# Patient Record
Sex: Male | Born: 1975 | Race: White | Hispanic: No | Marital: Married | Smoking: Never smoker
Health system: Southern US, Community
[De-identification: ages and names within clinical notes are randomized; demographics above are authoritative.]

## PROBLEM LIST (undated history)

## (undated) DIAGNOSIS — R7303 Prediabetes: Secondary | ICD-10-CM

## (undated) HISTORY — PX: TONSILLECTOMY: SUR1361

## (undated) HISTORY — PX: KNEE ARTHROSCOPY: SHX127

## (undated) HISTORY — PX: WISDOM TOOTH EXTRACTION: SHX21

---

## 2015-03-17 ENCOUNTER — Ambulatory Visit
Admission: EM | Admit: 2015-03-17 | Discharge: 2015-03-17 | Disposition: A | Discharge status: Home / Self Care | Payer: BLUE CROSS/BLUE SHIELD | Attending: Family Medicine | Admitting: Family Medicine

## 2015-03-17 DIAGNOSIS — R1011 Right upper quadrant pain: Secondary | ICD-10-CM | POA: Diagnosis not present

## 2015-03-17 DIAGNOSIS — R109 Unspecified abdominal pain: Secondary | ICD-10-CM

## 2015-03-17 DIAGNOSIS — R100 Acute abdomen: Secondary | ICD-10-CM | POA: Diagnosis not present

## 2015-03-17 DIAGNOSIS — Z711 Person with feared health complaint in whom no diagnosis is made: Secondary | ICD-10-CM

## 2015-03-17 LAB — COMPREHENSIVE METABOLIC PANEL
ALBUMIN: 4.7 g/dL (ref 3.5–5.0)
ALT: 26 U/L (ref 17–63)
AST: 32 U/L (ref 15–41)
Alkaline Phosphatase: 57 U/L (ref 38–126)
Anion gap: 8 (ref 5–15)
BILIRUBIN TOTAL: 0.7 mg/dL (ref 0.3–1.2)
BUN: 21 mg/dL — AB (ref 6–20)
CO2: 27 mmol/L (ref 22–32)
Calcium: 9.6 mg/dL (ref 8.9–10.3)
Chloride: 99 mmol/L — ABNORMAL LOW (ref 101–111)
Creatinine, Ser: 1.17 mg/dL (ref 0.61–1.24)
GFR calc Af Amer: >60 mL/min (ref >60)
GFR calc non Af Amer: >60 mL/min (ref >60)
GLUCOSE: 106 mg/dL — AB (ref 65–99)
POTASSIUM: 4.5 mmol/L (ref 3.5–5.1)
Sodium: 134 mmol/L — ABNORMAL LOW (ref 135–145)
TOTAL PROTEIN: 8.3 g/dL — AB (ref 6.5–8.1)

## 2015-03-17 LAB — URINALYSIS COMPLETE WITH MICROSCOPIC (ARMC ONLY)
BILIRUBIN URINE: NEGATIVE
GLUCOSE, UA: NEGATIVE mg/dL
Hgb urine dipstick: NEGATIVE
Ketones, ur: NEGATIVE mg/dL
LEUKOCYTES UA: NEGATIVE
Nitrite: NEGATIVE
PH: 6.5 (ref 5.0–8.0)
Protein, ur: NEGATIVE mg/dL
RBC / HPF: NONE SEEN RBC/hpf (ref 0–5)
SQUAMOUS EPITHELIAL / LPF: NONE SEEN
Specific Gravity, Urine: 1.025 (ref 1.005–1.030)

## 2015-03-17 LAB — CBC WITH DIFFERENTIAL/PLATELET
Basophils Absolute: 0.1 10*3/uL (ref 0–0.1)
Basophils Relative: 1 %
EOS ABS: 0.1 10*3/uL (ref 0–0.7)
EOS PCT: 2 %
HCT: 46.4 % (ref 40.0–52.0)
Hemoglobin: 15.7 g/dL (ref 13.0–18.0)
LYMPHS ABS: 2 10*3/uL (ref 1.0–3.6)
Lymphocytes Relative: 24 %
MCH: 29.1 pg (ref 26.0–34.0)
MCHC: 33.9 g/dL (ref 32.0–36.0)
MCV: 85.8 fL (ref 80.0–100.0)
MONO ABS: 1.2 10*3/uL — AB (ref 0.2–1.0)
MONOS PCT: 14 %
Neutro Abs: 5.3 10*3/uL (ref 1.4–6.5)
Neutrophils Relative %: 61 %
PLATELETS: 191 10*3/uL (ref 150–440)
RBC: 5.4 MIL/uL (ref 4.40–5.90)
RDW: 13 % (ref 11.5–14.5)
WBC: 8.7 10*3/uL (ref 3.8–10.6)

## 2015-03-17 LAB — AMYLASE: AMYLASE: 71 U/L (ref 28–100)

## 2015-03-17 LAB — LIPASE, BLOOD: Lipase: 29 U/L (ref 11–51)

## 2015-03-17 MED ORDER — SUCRALFATE 1 G PO TABS: 2.0000 g | ORAL_TABLET | Freq: Two times a day (BID) | ORAL | Status: DC

## 2015-03-17 MED ORDER — HYDROCODONE-ACETAMINOPHEN 5-325 MG PO TABS: 1.0000 | ORAL_TABLET | Freq: Three times a day (TID) | ORAL | Status: DC | PRN

## 2015-03-17 NOTE — ED Provider Notes (Signed)
CSN: [id number]     Arrival date & time 03/17/15  1839 History   First MD Initiated Contact with Patient 03/17/15 1928    Nurses notes were reviewed. Chief Complaint  Patient presents with  . Abdominal Pain   Patient reports 2 day history of abdominal pain. Abdominal pain started yesterday. Can't was states whether it was early morning but has been persistent. He states his been some minor fluctuations he can really tell major. He has any a lot of fatty foods but has has some food cooked in oils and combined with cheeses. He denies any nausea vomiting. Denies any diarrhea. He states that he has moved his bowels several times a day stools off a little bit but no improvement with the discomfort. No signs of changes of the pain. No history of fever no previous history abdominal surgery either.   No pertinent medical history other than tonsillectomy and some orthopedic surgeries. Never smoked he is allergic to penicillin. He does use alcohol but doesn't drink excessively.  His paternal grandfather has diabetes and his father has been recently diagnosed with oral cancer he doesn't smoke. The also household is sick.   (Consider location/radiation/quality/duration/timing/severity/associated sxs/prior Treatment) Patient is a 40 y.o. male presenting with abdominal pain. The history is provided by the patient. No language interpreter was used.  Abdominal Pain Pain location:  Periumbilical and RUQ Pain quality: cramping, fullness and pressure   Pain radiates to:  Does not radiate Pain severity:  Moderate Duration:  2 days Timing:  Constant Progression:  Worsening Chronicity:  New Context: alcohol use and eating   Context: not awakening from sleep, not diet changes, not laxative use, not medication withdrawal, not previous surgeries, not recent illness, not retching, not sick contacts and not suspicious food intake   Relieved by:  Nothing Ineffective treatments:  Belching and bowel  activity Associated symptoms: anorexia   Risk factors: no alcohol abuse, has not had multiple surgeries, no NSAID use and no recent hospitalization     History reviewed. No pertinent past medical history. Past Surgical History  Procedure Laterality Date  . Tonsillectomy     History reviewed. No pertinent family history. Social History  Substance Use Topics  . Smoking status: Never Smoker   . Smokeless tobacco: None  . Alcohol Use: No    Review of Systems  Gastrointestinal: Positive for abdominal pain and anorexia.  All other systems reviewed and are negative.   Allergies  Penicillins  Home Medications   Prior to Admission medications   Medication Sig Start Date End Date Taking? Authorizing Provider  HYDROcodone-acetaminophen (NORCO) 5-325 MG tablet Take 1 tablet by mouth every 8 (eight) hours as needed for moderate pain. 03/17/15   Frederich Cha, MD  sucralfate (CARAFATE) 1 g tablet Take 2 tablets (2 g total) by mouth 2 (two) times daily. An hour before eating. 03/17/15   Frederich Cha, MD   Meds Ordered and Administered this Visit  Medications - No data to display  BP 115/76 mmHg  Pulse 66  Temp(Src) 97.6 F (36.4 C) (Oral)  Resp 16  SpO2 100% No data found.   Physical Exam  Constitutional: He is oriented to person, place, and time. He appears well-developed.  HENT:  Head: Normocephalic.  Eyes: Conjunctivae are normal. Pupils are equal, round, and reactive to light.  Neck: Normal range of motion.  Cardiovascular: Normal rate, regular rhythm and normal heart sounds.   Pulmonary/Chest: Effort normal and breath sounds normal. No respiratory distress.  Abdominal: Soft.  Bowel sounds are normal. There is no hepatosplenomegaly. There is tenderness in the right upper quadrant and epigastric area. There is no CVA tenderness. No hernia. Hernia confirmed negative in the ventral area.    Musculoskeletal: Normal range of motion.  Lymphadenopathy:    He has no cervical  adenopathy.  Neurological: He is alert and oriented to person, place, and time.  Skin: Skin is warm and dry.  Psychiatric: He has a normal mood and affect.  Vitals reviewed.   ED Course  Procedures (including critical care time)  Labs Review Labs Reviewed  CBC WITH DIFFERENTIAL/PLATELET - Abnormal; Notable for the following:    Monocytes Absolute 1.2 (*)    All other components within normal limits  URINALYSIS COMPLETEWITH MICROSCOPIC ([facility] ONLY) - Abnormal; Notable for the following:    Bacteria, UA RARE (*)    All other components within normal limits  COMPREHENSIVE METABOLIC PANEL - Abnormal; Notable for the following:    Sodium 134 (*)    Chloride 99 (*)    Glucose, Bld 106 (*)    BUN 21 (*)    Total Protein 8.3 (*)    All other components within normal limits  URINE CULTURE  AMYLASE  LIPASE, BLOOD  H PYLORI, IGM, IGG, IGA AB    Imaging Review No results found.   Visual Acuity Review  Right Eye Distance:   Left Eye Distance:   Bilateral Distance:    Right Eye Near:   Left Eye Near:    Bilateral Near:     Results for orders placed or performed during the hospital encounter of 03/17/15  CBC with Differential  Result Value Ref Range   WBC 8.7 3.8 - 10.6 K/uL   RBC 5.40 4.40 - 5.90 MIL/uL   Hemoglobin 15.7 13.0 - 18.0 g/dL   HCT 46.4 40.0 - 52.0 %   MCV 85.8 80.0 - 100.0 fL   MCH 29.1 26.0 - 34.0 pg   MCHC 33.9 32.0 - 36.0 g/dL   RDW 13.0 11.5 - 14.5 %   Platelets 191 150 - 440 K/uL   Neutrophils Relative % 61 %   Neutro Abs 5.3 1.4 - 6.5 K/uL   Lymphocytes Relative 24 %   Lymphs Abs 2.0 1.0 - 3.6 K/uL   Monocytes Relative 14 %   Monocytes Absolute 1.2 (H) 0.2 - 1.0 K/uL   Eosinophils Relative 2 %   Eosinophils Absolute 0.1 0 - 0.7 K/uL   Basophils Relative 1 %   Basophils Absolute 0.1 0 - 0.1 K/uL  Amylase  Result Value Ref Range   Amylase 71 28 - 100 U/L  Lipase, blood  Result Value Ref Range   Lipase 29 11 - 51 U/L  Urinalysis complete,  with microscopic  Result Value Ref Range   Color, Urine YELLOW YELLOW   APPearance CLEAR CLEAR   Glucose, UA NEGATIVE NEGATIVE mg/dL   Bilirubin Urine NEGATIVE NEGATIVE   Ketones, ur NEGATIVE NEGATIVE mg/dL   Specific Gravity, Urine 1.025 1.005 - 1.030   Hgb urine dipstick NEGATIVE NEGATIVE   pH 6.5 5.0 - 8.0   Protein, ur NEGATIVE NEGATIVE mg/dL   Nitrite NEGATIVE NEGATIVE   Leukocytes, UA NEGATIVE NEGATIVE   RBC / HPF NONE SEEN 0 - 5 RBC/hpf   WBC, UA 0-5 0 - 5 WBC/hpf   Bacteria, UA RARE (A) NONE SEEN   Squamous Epithelial / LPF NONE SEEN NONE SEEN  Comprehensive metabolic panel  Result Value Ref Range   Sodium 134 (L)  135 - 145 mmol/L   Potassium 4.5 3.5 - 5.1 mmol/L   Chloride 99 (L) 101 - 111 mmol/L   CO2 27 22 - 32 mmol/L   Glucose, Bld 106 (H) 65 - 99 mg/dL   BUN 21 (H) 6 - 20 mg/dL   Creatinine, Ser 1.17 0.61 - 1.24 mg/dL   Calcium 9.6 8.9 - 10.3 mg/dL   Total Protein 8.3 (H) 6.5 - 8.1 g/dL   Albumin 4.7 3.5 - 5.0 g/dL   AST 32 15 - 41 U/L   ALT 26 17 - 63 U/L   Alkaline Phosphatase 57 38 - 126 U/L   Total Bilirubin 0.7 0.3 - 1.2 mg/dL   GFR calc non Af Amer >60 >60 mL/min   GFR calc Af Amer >60 >60 mL/min   Anion gap 8 5 - 15     MDM   1. Concern about gallstones without diagnosis   2. Right upper quadrant pain   3. Abdominal pain, acute      Lab tests negative essentially. Still awaiting H. pylori. We'll plan for ultrasound of his gallbladder tomorrow. Avastin that if he does not hear from [place] by 10 to please call the office. Will going to placement Carafate 2 tablets twice a day also hydrocodone as needed for pain discomfort. Follow-up PCP if not better in a week obviously. Work note written for to tomorrow as well. If abdominal pain gets worse she'll need to go to the ED of his choice. Note: This dictation was prepared with Dragon dictation along with smaller phrase technology. Any transcriptional errors that result from this process are  unintentional.    Frederich Cha, MD 03/17/15 2133

## 2015-03-17 NOTE — ED Notes (Signed)
abd pain reported.  Epigastric in nature.  "feels like gas cramp".  Still able to eat.  Denies nausea.   Similar symptoms were reported in a coworker.

## 2015-03-17 NOTE — Discharge Instructions (Signed)
Abdominal Pain, Adult °Many things can cause belly (abdominal) pain. Most times, the belly pain is not dangerous. Many cases of belly pain can be watched and treated at home. °HOME CARE  °· Do not take medicines that help you go poop (laxatives) unless told to by your doctor. °· Only take medicine as told by your doctor. °· Eat or drink as told by your doctor. Your doctor will tell you if you should be on a special diet. °GET HELP IF: °· You do not know what is causing your belly pain. °· You have belly pain while you are sick to your stomach (nauseous) or have runny poop (diarrhea). °· You have pain while you pee or poop. °· Your belly pain wakes you up at night. °· You have belly pain that gets worse or better when you eat. °· You have belly pain that gets worse when you eat fatty foods. °· You have a fever. °GET HELP RIGHT AWAY IF:  °· The pain does not go away within 2 hours. °· You keep throwing up (vomiting). °· The pain changes and is only in the right or left part of the belly. °· You have bloody or tarry looking poop. °MAKE SURE YOU:  °· Understand these instructions. °· Will watch your condition. °· Will get help right away if you are not doing well or get worse. °  °This information is not intended to replace advice given to you by your health care provider. Make sure you discuss any questions you have with your health care provider. °  °Document Released: 07/11/2007 Document Revised: 02/12/2014 Document Reviewed: 10/01/2012 °Elsevier Interactive Patient Education ©2016 [employer] ° °Pain Without a Known Cause °WHAT IS PAIN WITHOUT A KNOWN CAUSE? °Pain can occur in any part of the body and can range from mild to severe. Sometimes no cause can be found for why you are having pain. Some types of pain that can occur without a known cause include:  °· Headache. °· Back pain. °· Abdominal pain. °· Neck pain. °HOW IS PAIN WITHOUT A KNOWN CAUSE DIAGNOSED?  °Your health care provider will try to find the cause  of your pain. This may include: °· Physical exam. °· Medical history. °· Blood tests. °· Urine tests. °· X-rays. °If no cause is found, your health care provider may diagnose you with pain without a known cause.  °IS THERE TREATMENT FOR PAIN WITHOUT A CAUSE?  °Treatment depends on the kind of pain you have. Your health care provider may prescribe medicines to help relieve your pain.  °WHAT CAN I DO AT HOME FOR MY PAIN?  °· Take medicines only as directed by your health care provider. °· Stop any activities that cause pain. During periods of severe pain, bed rest may help. °· Try to reduce your stress with activities such as yoga or meditation. Talk to your health care provider for other stress-reducing activity recommendations. °· Exercise regularly, if approved by your health care provider. °· Eat a healthy diet that includes fruits and vegetables. This may improve pain. Talk to your health care provider if you have any questions about your diet. °WHAT IF MY PAIN DOES NOT GET BETTER?  °If you have a painful condition and no reason can be found for the pain or the pain gets worse, it is important to follow up with your health care provider. It may be necessary to repeat tests and look further for a possible cause.  °  °This information is not   intended to replace advice given to you by your health care provider. Make sure you discuss any questions you have with your health care provider. °  °Document Released: 10/17/2000 Document Revised: 02/12/2014 Document Reviewed: 06/09/2013 °Elsevier Interactive Patient Education ©2016 [employer] ° °

## 2015-03-17 NOTE — ED Notes (Signed)
Ambulatory to treatment room.  In NAD.  Reporting MID EPIGASTRIC pain.  Last BM was today.  No issues reported.

## 2015-03-18 ENCOUNTER — Ambulatory Visit
Admission: RE | Admit: 2015-03-18 | Discharge: 2015-03-18 | Disposition: A | Discharge status: Home / Self Care | Payer: BLUE CROSS/BLUE SHIELD | Source: Ambulatory Visit | Attending: Family Medicine | Admitting: Family Medicine

## 2015-03-18 ENCOUNTER — Telehealth: Payer: Self-pay | Admitting: Emergency Medicine

## 2015-03-18 DIAGNOSIS — R1010 Upper abdominal pain, unspecified: Secondary | ICD-10-CM | POA: Insufficient documentation

## 2015-03-18 DIAGNOSIS — K7689 Other specified diseases of liver: Secondary | ICD-10-CM | POA: Diagnosis not present

## 2015-03-18 NOTE — ED Notes (Signed)
Returned [place] results to pt via phone per Dr. Zenda Alpers. Pt was notified that [place] of gallbladder came back negative for gallstones. An incidental finding of 1.9 cm probable hemangioma in the right lobe of liver was reported to pt as well and was advised to follow up with pcp for further eval. Pt understanding and is ok of results.

## 2015-03-20 LAB — URINE CULTURE
Culture: NO GROWTH
Special Requests: NORMAL

## 2015-03-21 LAB — H PYLORI, IGM, IGG, IGA AB
H. Pylogi, Iga Abs: <9 units (ref 0.0–8.9)
H. Pylogi, Igm Abs: <9 units (ref 0.0–8.9)

## 2015-03-22 ENCOUNTER — Telehealth: Payer: Self-pay

## 2015-03-22 NOTE — ED Notes (Signed)
Patient contacted and informed Labs were negative and that Ultrasound showed ? Hemangioma and that followup with primary care doctor recommended in 6-12 months. States had already received a call from [place].

## 2018-02-09 IMAGING — US US ABDOMEN LIMITED
1 series · 14 of 25 positions shown · non-contrast
Comparison: None.

CLINICAL DATA: Upper abdominal pain for 2 days.

EXAM:
US ABDOMEN LIMITED - RIGHT UPPER QUADRANT

[Series 1: us abdomen limited · 0.19mm/px · 14 of 49 slices shown]
[im 1/49]
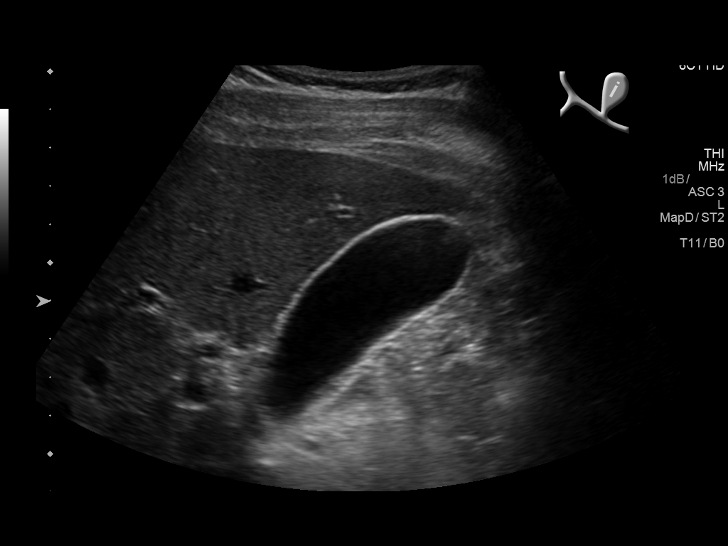
[im 5/49]
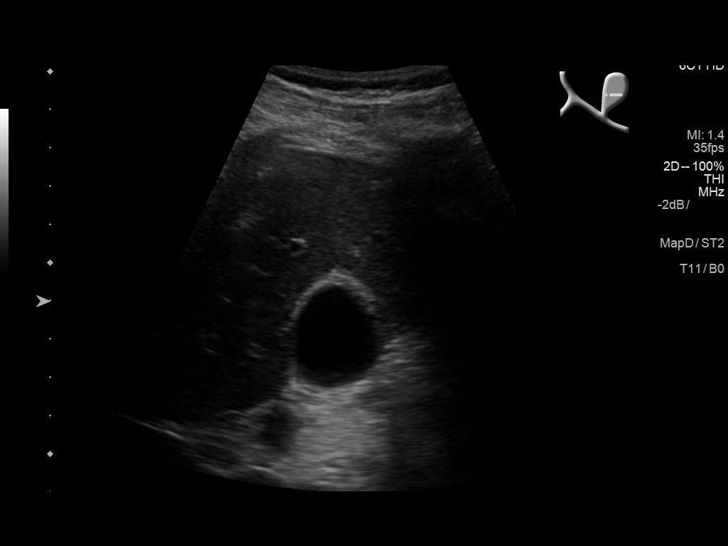
[im 9/49]
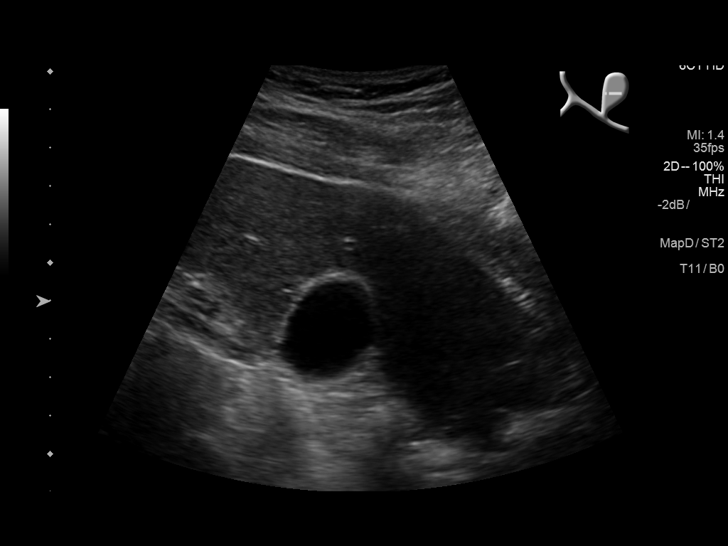
[im 13/49]
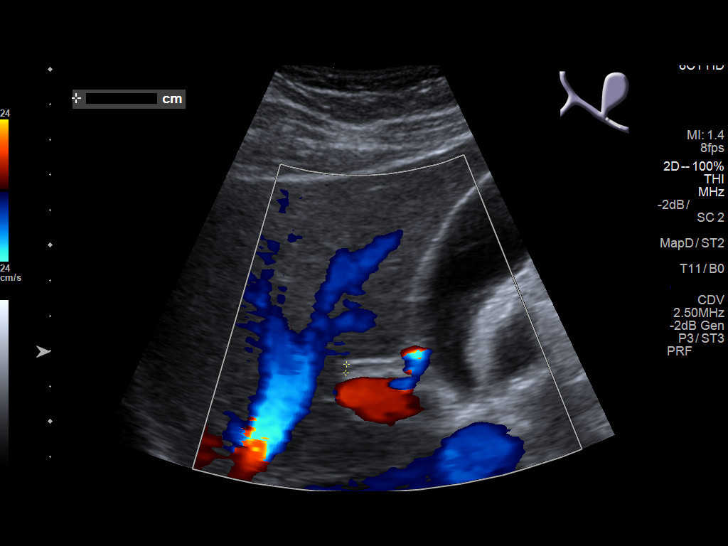
[im 17/49]
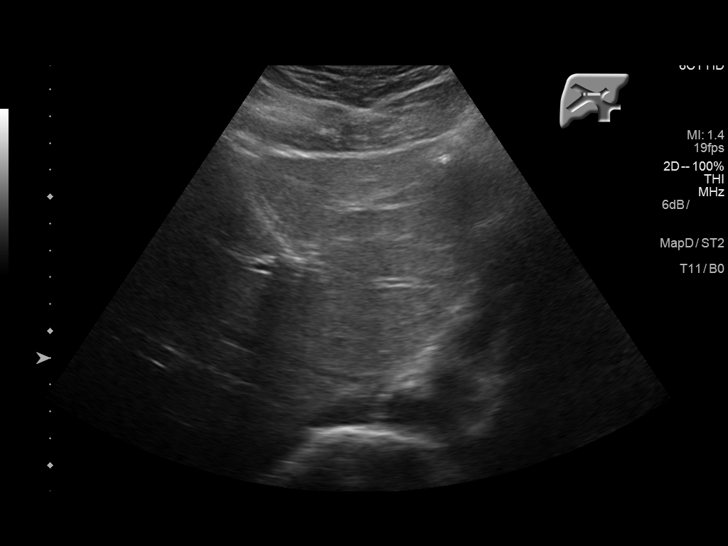
[im 19/49]
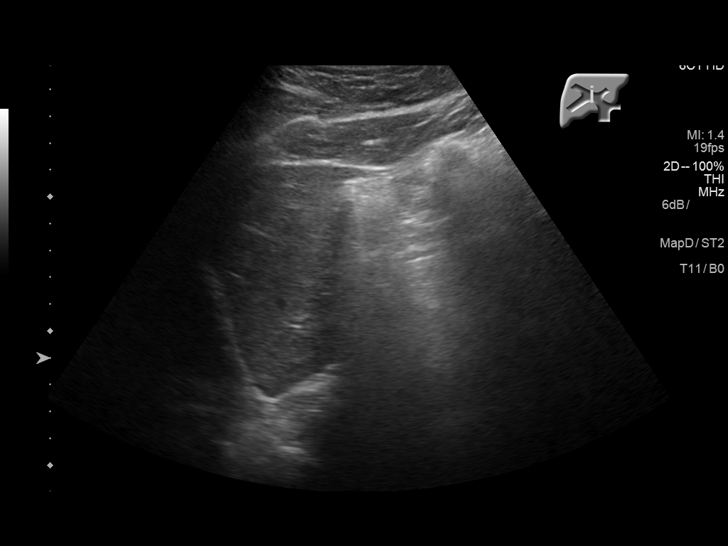
[im 23/49]
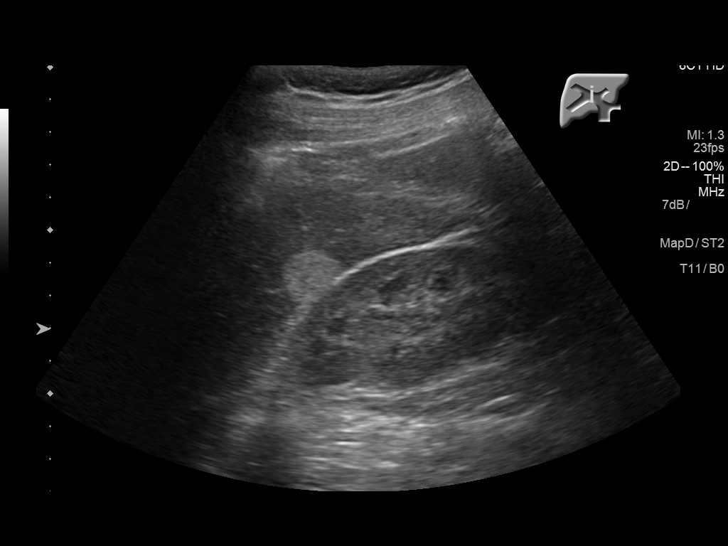
[im 27/49]
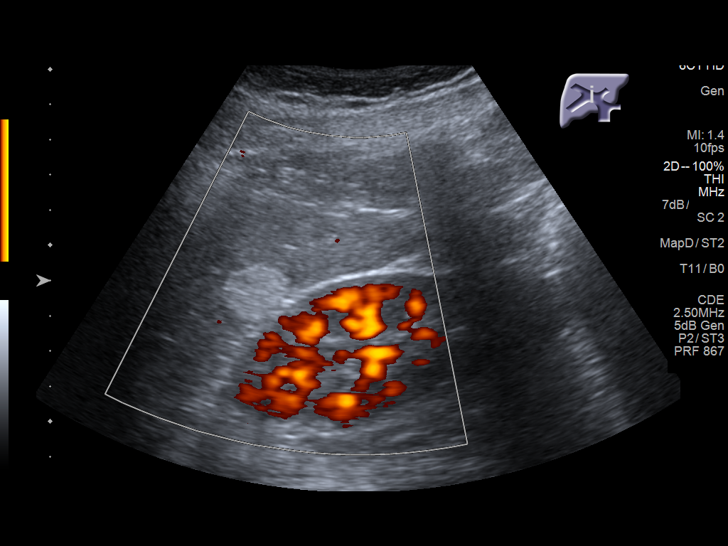
[im 31/49]
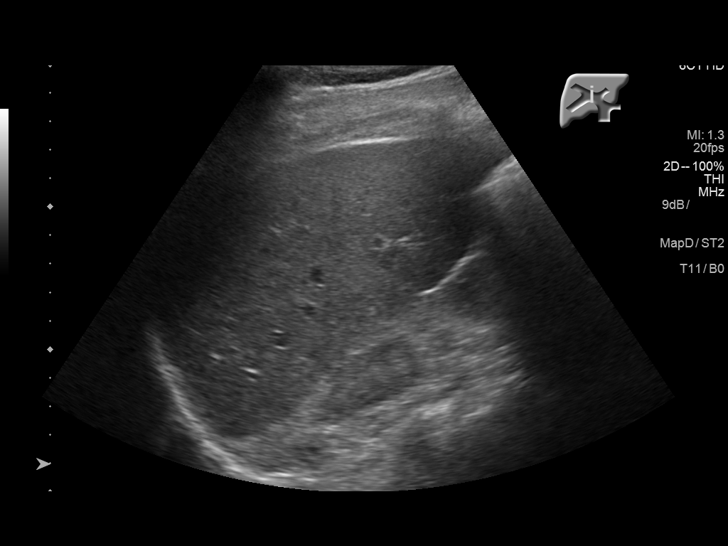
[im 33/49]
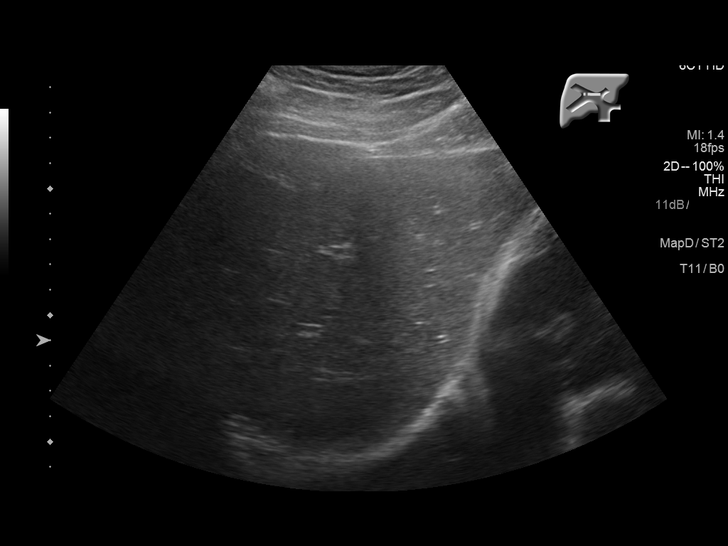
[im 37/49]
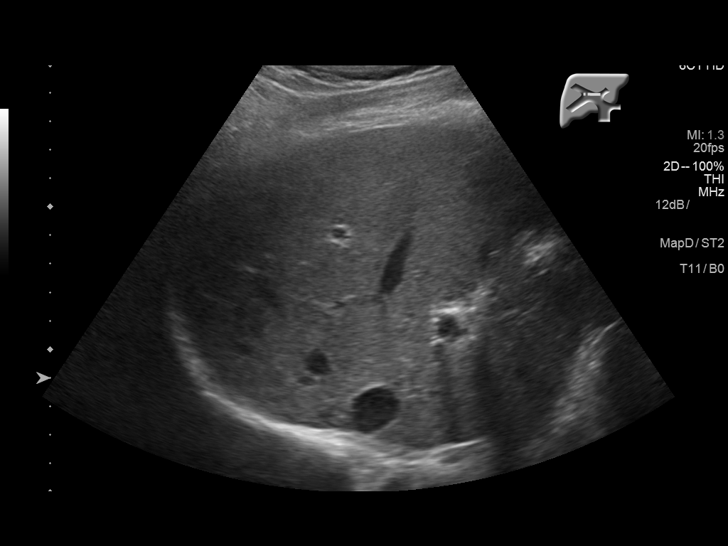
[im 41/49]
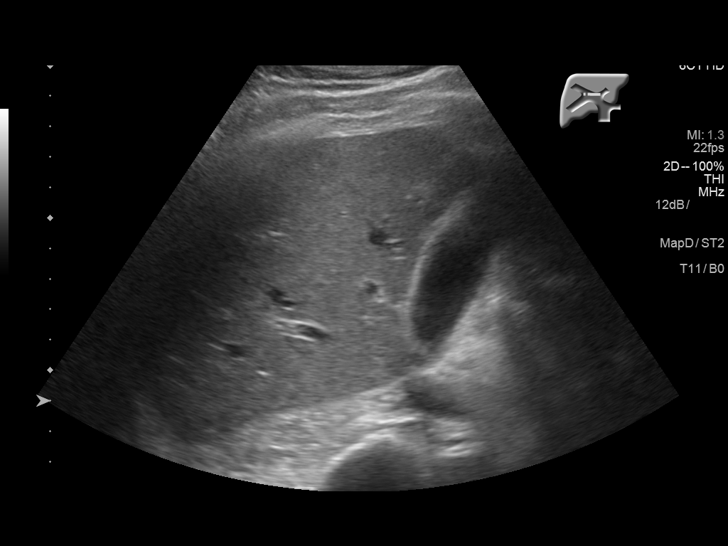
[im 45/49]
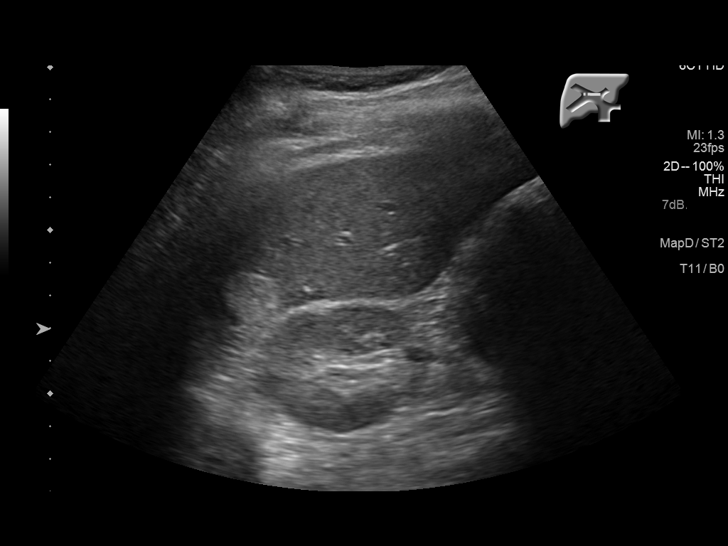
[im 49/49]
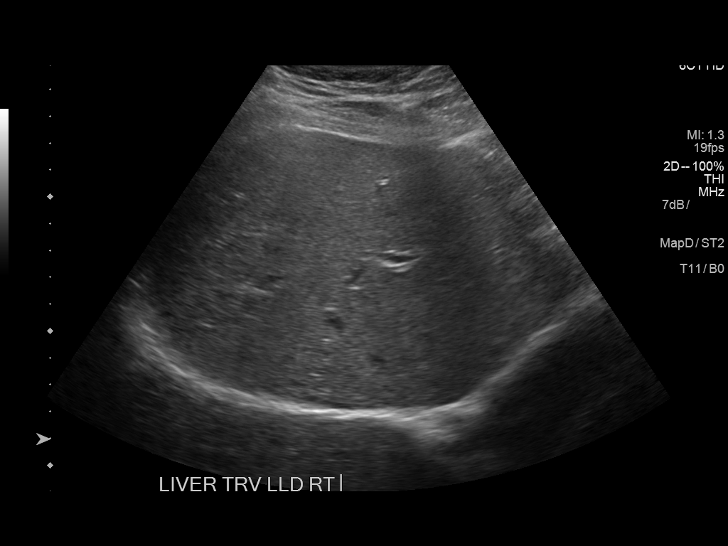

[14 of 25 positions shown; findings below may reference images not displayed]

FINDINGS: Gallbladder:

No gallstones or wall thickening visualized. No sonographic Murphy
sign noted by sonographer. Gallbladder wall measures 1.7 mm.

Common bile duct:

Diameter: 2.4 mm.

Liver:

Within normal limits in parenchymal echogenicity. There is a 1.8 x
1.9 x 1.5 cm subcapsular hyperechoic mass within the right lobe of
the liver, which in the absence of findings of cirrhosis likely
represents a hemangioma.
IMPRESSION: Normal appearance of the gallbladder.

1.9 cm probable hemangioma in the right lobe of the liver, otherwise
normal appearance of the liver. Follow-up in 6-12 months may be
considered.

## 2019-04-21 ENCOUNTER — Ambulatory Visit: Payer: Self-pay | Admitting: Surgery

## 2019-04-21 NOTE — H&P (Signed)
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 male who was referred by Heron Nay * for evaluation of above. First noted several years ago.  Asymptomatic, but doesn't like the look of it so will like to consider removal   Past Medical History:  has a past medical history of Allergic state, Hyperlipidemia, Hypothyroid, unspecified (05/28/2012), and OSA (obstructive sleep apnea) (05/23/2012).  Past Surgical History:  has a past surgical history that includes Tonsillectomy; knee surgery; and vasectomy.  Family History: family history includes Alzheimer's disease in his maternal grandfather and maternal grandmother; Cancer in his father; Diabetes type II in his maternal grandfather; Hyperlipidemia (Elevated cholesterol) in his mother.  Social History:  reports that he has never smoked. He has never used smokeless tobacco. He reports current alcohol use of about 1.0 standard drinks of alcohol per week. He reports that he does not use drugs.  Current Medications: has a current medication list which includes the following prescription(s): rosuvastatin, fluticasone propionate, and hydrocodone-homatropine.  Allergies:       Allergies  Allergen Reactions  . Demerol (Pf) [Meperidine (Pf)] Unknown  . Penicillins Other (See Comments)    Other Reaction: Allergy    ROS:  A 15 point review of systems was performed and pertinent positives and negatives noted in HPI   Objective:   BP 124/71   Pulse 101   Ht 170.2 cm (5\' 7" )   Wt 77.5 kg (170 lb 14.4 oz)   BMI 26.77 kg/m   Constitutional :  alert, appears stated age, cooperative and no distress  Lymphatics/Throat:  no asymmetry, masses, or scars  Respiratory:  clear to auscultation bilaterally  Cardiovascular:  irregularly irregular rhythm  Gastrointestinal: soft, non-tender; bowel sounds normal; no masses,  no organomegaly.    Musculoskeletal: Steady gait and movement  Skin:  Cool and moist, left perineal region with increased soft tissue, with another discrete smooth, soft nodular mass atop the increased soft tissue.  Palpation consistent with two lipomas on top of each other, with chronic skin changes overlying the top lipoma. Non-tender, no erythema, induration to indicate infection.  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  Alternatives include continued observation.  Benefits include possible symptom relief, pathologic evaluation, improved cosmesis. Discussed the risk of surgery including recurrence, chronic pain, post-op infxn, poor cosmesis, poor/delayed wound healing, and possible re-operation to address said risks. The risks of general anesthetic, if used, includes MI, CVA, sudden death or even reaction to anesthetic medications also discussed.  Typical post-op recovery time of 3-5 days with possible activity restrictions were also discussed.  The patient verbalized understanding and all questions were answered to the patient's satisfaction.  2. Patient has elected to proceed with surgical treatment. Procedure will be scheduled in OR due to location.  Written consent was obtained. High litho position

## 2019-04-21 NOTE — H&P (View-Only) (Signed)
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 male who was referred by Heron Nay * for evaluation of above. First noted several years ago.  Asymptomatic, but doesn't like the look of it so will like to consider removal   Past Medical History:  has a past medical history of Allergic state, Hyperlipidemia, Hypothyroid, unspecified (05/28/2012), and OSA (obstructive sleep apnea) (05/23/2012).  Past Surgical History:  has a past surgical history that includes Tonsillectomy; knee surgery; and vasectomy.  Family History: family history includes Alzheimer's disease in his maternal grandfather and maternal grandmother; Cancer in his father; Diabetes type II in his maternal grandfather; Hyperlipidemia (Elevated cholesterol) in his mother.  Social History:  reports that he has never smoked. He has never used smokeless tobacco. He reports current alcohol use of about 1.0 standard drinks of alcohol per week. He reports that he does not use drugs.  Current Medications: has a current medication list which includes the following prescription(s): rosuvastatin, fluticasone propionate, and hydrocodone-homatropine.  Allergies:       Allergies  Allergen Reactions  . Demerol (Pf) [Meperidine (Pf)] Unknown  . Penicillins Other (See Comments)    Other Reaction: Allergy    ROS:  A 15 point review of systems was performed and pertinent positives and negatives noted in HPI   Objective:   BP 124/71   Pulse 101   Ht 170.2 cm (5\' 7" )   Wt 77.5 kg (170 lb 14.4 oz)   BMI 26.77 kg/m   Constitutional :  alert, appears stated age, cooperative and no distress  Lymphatics/Throat:  no asymmetry, masses, or scars  Respiratory:  clear to auscultation bilaterally  Cardiovascular:  irregularly irregular rhythm  Gastrointestinal: soft, non-tender; bowel sounds normal; no masses,  no organomegaly.    Musculoskeletal: Steady gait and movement  Skin:  Cool and moist, left perineal region with increased soft tissue, with another discrete smooth, soft nodular mass atop the increased soft tissue.  Palpation consistent with two lipomas on top of each other, with chronic skin changes overlying the top lipoma. Non-tender, no erythema, induration to indicate infection.  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  Alternatives include continued observation.  Benefits include possible symptom relief, pathologic evaluation, improved cosmesis. Discussed the risk of surgery including recurrence, chronic pain, post-op infxn, poor cosmesis, poor/delayed wound healing, and possible re-operation to address said risks. The risks of general anesthetic, if used, includes MI, CVA, sudden death or even reaction to anesthetic medications also discussed.  Typical post-op recovery time of 3-5 days with possible activity restrictions were also discussed.  The patient verbalized understanding and all questions were answered to the patient's satisfaction.  2. Patient has elected to proceed with surgical treatment. Procedure will be scheduled in OR due to location.  Written consent was obtained. High litho position

## 2019-04-30 ENCOUNTER — Encounter
Admission: RE | Admit: 2019-04-30 | Discharge: 2019-04-30 | Disposition: A | Discharge status: Home / Self Care | Payer: BC Managed Care – PPO | Source: Ambulatory Visit | Attending: Surgery | Admitting: Surgery

## 2019-04-30 ENCOUNTER — Other Ambulatory Visit: Payer: Self-pay

## 2019-04-30 HISTORY — DX: Prediabetes: R73.03

## 2019-04-30 NOTE — Patient Instructions (Signed)
Your procedure is scheduled on: Thurs 4/1 Report to Day Surgery. To find out your arrival time please call [phone] between 1PM - 3PM Wed. 3/31  Remember: Instructions that are not followed completely may result in serious medical risk,  up to and including death, or upon the discretion of your surgeon and anesthesiologist your  surgery may need to be rescheduled.     _X__ 1. Do not eat food after midnight the night before your procedure.                 No gum chewing or hard candies. You may drink clear liquids up to 2 hours                 before you are scheduled to arrive for your surgery- DO not drink clear                 liquids within 2 hours of the start of your surgery.                 Clear Liquids include:  water, apple juice without pulp, clear Gatorade, G2 or                  Gatorade Zero (avoid Red/Purple/Blue), Black Coffee or Tea (Do not add                 anything to coffee or tea). _____2.   Complete the carbohydrate drink provided to you, 2 hours before arrival.  __X__2.  On the morning of surgery brush your teeth with toothpaste and water, you                may rinse your mouth with mouthwash if you wish.  Do not swallow any toothpaste of mouthwash.     _X__ 3.  No Alcohol for 24 hours before or after surgery.   ___ 4.  Do Not Smoke or use e-cigarettes For 24 Hours Prior to Your Surgery.                 Do not use any chewable tobacco products for at least 6 hours prior to                 surgery.  ____  5.  Bring all medications with you on the day of surgery if instructed.   __x__  6.  Notify your doctor if there is any change in your medical condition      (cold, fever, infections).     Do not wear jewelry,  Do not wear lotions. You may wear deodorant. Do not shave 48 hours prior to surgery. Men may shave face and neck. Do not bring valuables to the hospital.    [facility] is not responsible for any belongings or  valuables.  Contacts, dentures or bridgework may not be worn into surgery. Leave your suitcase in the car. After surgery it may be brought to your room. For patients admitted to the hospital, discharge time is determined by your treatment team.   Patients discharged the day of surgery will not be allowed to drive home.   Make arrangements for someone to be with you for the first 24 hours of your Same Day Discharge.    Please read over the following fact sheets that you were given:      __x__ Take these medicines the morning of surgery with A SIP OF WATER:    1. crestor  2.   3.  4.  5.  6.  ____ Fleet Enema (as directed)   __x__ Use CHG Soap (or wipes) as directed  ____ Use Benzoyl Peroxide Gel as instructed  ____ Use inhalers on the day of surgery  ____ Stop metformin 2 days prior to surgery    ____ Take 1/2 of usual insulin dose the night before surgery. No insulin the morning          of surgery.   ____ Stop Coumadin/Plavix/aspirin on  _x__ Stop Anti-inflammatories no ibuprofen aleve or aspirin until after surgery        May take tylenol   ____ Stop supplements until after surgery.    ____ Bring C-Pap to the hospital.

## 2019-05-05 ENCOUNTER — Other Ambulatory Visit: Payer: Self-pay

## 2019-05-05 ENCOUNTER — Other Ambulatory Visit
Admission: RE | Admit: 2019-05-05 | Discharge: 2019-05-05 | Disposition: A | Discharge status: Home / Self Care | Payer: BC Managed Care – PPO | Source: Ambulatory Visit | Attending: Surgery | Admitting: Surgery

## 2019-05-05 DIAGNOSIS — Z20822 Contact with and (suspected) exposure to covid-19: Secondary | ICD-10-CM | POA: Insufficient documentation

## 2019-05-05 DIAGNOSIS — Z01812 Encounter for preprocedural laboratory examination: Secondary | ICD-10-CM | POA: Diagnosis not present

## 2019-05-05 LAB — SARS CORONAVIRUS 2 (TAT 6-24 HRS): SARS Coronavirus 2: NEGATIVE

## 2019-05-06 MED ORDER — CLINDAMYCIN PHOSPHATE 900 MG/50ML IV SOLN
900.0000 mg | INTRAVENOUS | Status: AC
  Administered 2019-05-07: 900 mg via INTRAVENOUS

## 2019-05-07 ENCOUNTER — Encounter
Admission: RE | Disposition: A | Discharge status: Home / Self Care | Payer: Self-pay | Source: Ambulatory Visit | Attending: Surgery

## 2019-05-07 ENCOUNTER — Ambulatory Visit: Payer: BC Managed Care – PPO | Admitting: Anesthesiology

## 2019-05-07 ENCOUNTER — Other Ambulatory Visit: Payer: Self-pay

## 2019-05-07 ENCOUNTER — Ambulatory Visit
Admission: RE | Admit: 2019-05-07 | Discharge: 2019-05-07 | Disposition: A | Discharge status: Home / Self Care | Payer: BC Managed Care – PPO | Source: Ambulatory Visit | Attending: Surgery | Admitting: Surgery

## 2019-05-07 ENCOUNTER — Encounter: Payer: Self-pay | Admitting: Surgery

## 2019-05-07 DIAGNOSIS — Z833 Family history of diabetes mellitus: Secondary | ICD-10-CM | POA: Diagnosis not present

## 2019-05-07 DIAGNOSIS — Z809 Family history of malignant neoplasm, unspecified: Secondary | ICD-10-CM | POA: Insufficient documentation

## 2019-05-07 DIAGNOSIS — G4733 Obstructive sleep apnea (adult) (pediatric): Secondary | ICD-10-CM | POA: Diagnosis not present

## 2019-05-07 DIAGNOSIS — Z820 Family history of epilepsy and other diseases of the nervous system: Secondary | ICD-10-CM | POA: Diagnosis not present

## 2019-05-07 DIAGNOSIS — Z880 Allergy status to penicillin: Secondary | ICD-10-CM | POA: Diagnosis not present

## 2019-05-07 DIAGNOSIS — D1724 Benign lipomatous neoplasm of skin and subcutaneous tissue of left leg: Secondary | ICD-10-CM

## 2019-05-07 DIAGNOSIS — D171 Benign lipomatous neoplasm of skin and subcutaneous tissue of trunk: Secondary | ICD-10-CM | POA: Insufficient documentation

## 2019-05-07 DIAGNOSIS — Z885 Allergy status to narcotic agent status: Secondary | ICD-10-CM | POA: Insufficient documentation

## 2019-05-07 DIAGNOSIS — Z8349 Family history of other endocrine, nutritional and metabolic diseases: Secondary | ICD-10-CM | POA: Diagnosis not present

## 2019-05-07 DIAGNOSIS — Z8249 Family history of ischemic heart disease and other diseases of the circulatory system: Secondary | ICD-10-CM | POA: Diagnosis not present

## 2019-05-07 DIAGNOSIS — E039 Hypothyroidism, unspecified: Secondary | ICD-10-CM | POA: Diagnosis not present

## 2019-05-07 DIAGNOSIS — R7303 Prediabetes: Secondary | ICD-10-CM | POA: Insufficient documentation

## 2019-05-07 DIAGNOSIS — E785 Hyperlipidemia, unspecified: Secondary | ICD-10-CM | POA: Insufficient documentation

## 2019-05-07 DIAGNOSIS — Z79899 Other long term (current) drug therapy: Secondary | ICD-10-CM | POA: Insufficient documentation

## 2019-05-07 HISTORY — PX: MASS EXCISION: SHX2000

## 2019-05-07 SURGERY — EXCISION MASS
Anesthesia: General | Site: Perineum | Laterality: Left

## 2019-05-07 MED ORDER — LACTATED RINGERS IV SOLN: INTRAVENOUS | Status: DC | PRN

## 2019-05-07 MED ORDER — ACETAMINOPHEN 500 MG PO TABS
ORAL_TABLET | ORAL | Status: AC
  Administered 2019-05-07: 1000 mg via ORAL
  Filled 2019-05-07: qty 2

## 2019-05-07 MED ORDER — FAMOTIDINE 20 MG PO TABS
ORAL_TABLET | ORAL | Status: AC
  Administered 2019-05-07: 20 mg via ORAL
  Filled 2019-05-07: qty 1

## 2019-05-07 MED ORDER — CELECOXIB 200 MG PO CAPS: 200.0000 mg | ORAL_CAPSULE | ORAL | Status: AC

## 2019-05-07 MED ORDER — FENTANYL CITRATE (PF) 100 MCG/2ML IJ SOLN: 25.0000 ug | INTRAMUSCULAR | Status: DC | PRN

## 2019-05-07 MED ORDER — EPHEDRINE SULFATE 50 MG/ML IJ SOLN
INTRAMUSCULAR | Status: DC | PRN
  Administered 2019-05-07: 10 mg via INTRAVENOUS

## 2019-05-07 MED ORDER — CHLORHEXIDINE GLUCONATE CLOTH 2 % EX PADS: 6.0000 | MEDICATED_PAD | Freq: Once | CUTANEOUS | Status: DC

## 2019-05-07 MED ORDER — DEXAMETHASONE SODIUM PHOSPHATE 10 MG/ML IJ SOLN
INTRAMUSCULAR | Status: DC | PRN
  Administered 2019-05-07: 10 mg via INTRAVENOUS

## 2019-05-07 MED ORDER — MIDAZOLAM HCL 2 MG/2ML IJ SOLN
INTRAMUSCULAR | Status: AC
  Filled 2019-05-07: qty 2

## 2019-05-07 MED ORDER — CELECOXIB 200 MG PO CAPS
ORAL_CAPSULE | ORAL | Status: AC
  Administered 2019-05-07: 200 mg via ORAL
  Filled 2019-05-07: qty 1

## 2019-05-07 MED ORDER — HYDROCODONE-ACETAMINOPHEN 5-325 MG PO TABS: 1.0000 | ORAL_TABLET | Freq: Four times a day (QID) | ORAL | 0 refills | Status: AC | PRN

## 2019-05-07 MED ORDER — BUPIVACAINE-EPINEPHRINE (PF) 0.5% -1:200000 IJ SOLN
INTRAMUSCULAR | Status: DC | PRN
  Administered 2019-05-07: 6 mL

## 2019-05-07 MED ORDER — GABAPENTIN 300 MG PO CAPS: 300.0000 mg | ORAL_CAPSULE | ORAL | Status: AC

## 2019-05-07 MED ORDER — GABAPENTIN 300 MG PO CAPS
ORAL_CAPSULE | ORAL | Status: AC
  Administered 2019-05-07: 300 mg via ORAL
  Filled 2019-05-07: qty 1

## 2019-05-07 MED ORDER — ACETAMINOPHEN 325 MG PO TABS: 650.0000 mg | ORAL_TABLET | Freq: Three times a day (TID) | ORAL | 0 refills | Status: AC | PRN

## 2019-05-07 MED ORDER — EPINEPHRINE PF 1 MG/ML IJ SOLN
INTRAMUSCULAR | Status: AC
  Filled 2019-05-07: qty 1

## 2019-05-07 MED ORDER — IBUPROFEN 800 MG PO TABS: 800.0000 mg | ORAL_TABLET | Freq: Three times a day (TID) | ORAL | 0 refills | Status: AC | PRN

## 2019-05-07 MED ORDER — LIDOCAINE HCL (CARDIAC) PF 100 MG/5ML IV SOSY
PREFILLED_SYRINGE | INTRAVENOUS | Status: DC | PRN
  Administered 2019-05-07: 100 mg via INTRAVENOUS

## 2019-05-07 MED ORDER — PROPOFOL 500 MG/50ML IV EMUL
INTRAVENOUS | Status: AC
  Filled 2019-05-07: qty 50

## 2019-05-07 MED ORDER — LACTATED RINGERS IV SOLN: INTRAVENOUS | Status: DC

## 2019-05-07 MED ORDER — PROMETHAZINE HCL 25 MG/ML IJ SOLN: 6.2500 mg | INTRAMUSCULAR | Status: DC | PRN

## 2019-05-07 MED ORDER — FENTANYL CITRATE (PF) 100 MCG/2ML IJ SOLN
INTRAMUSCULAR | Status: AC
  Filled 2019-05-07: qty 2

## 2019-05-07 MED ORDER — BUPIVACAINE HCL (PF) 0.5 % IJ SOLN
INTRAMUSCULAR | Status: AC
  Filled 2019-05-07: qty 30

## 2019-05-07 MED ORDER — FENTANYL CITRATE (PF) 100 MCG/2ML IJ SOLN
INTRAMUSCULAR | Status: DC | PRN
  Administered 2019-05-07 (×2): 25 ug via INTRAVENOUS
  Administered 2019-05-07: 50 ug via INTRAVENOUS

## 2019-05-07 MED ORDER — BUPIVACAINE LIPOSOME 1.3 % IJ SUSP
INTRAMUSCULAR | Status: AC
  Filled 2019-05-07: qty 20

## 2019-05-07 MED ORDER — ONDANSETRON HCL 4 MG/2ML IJ SOLN
INTRAMUSCULAR | Status: DC | PRN
  Administered 2019-05-07: 4 mg via INTRAVENOUS

## 2019-05-07 MED ORDER — DOCUSATE SODIUM 100 MG PO CAPS: 100.0000 mg | ORAL_CAPSULE | Freq: Two times a day (BID) | ORAL | 0 refills | Status: AC | PRN

## 2019-05-07 MED ORDER — PROPOFOL 10 MG/ML IV BOLUS
INTRAVENOUS | Status: DC | PRN
  Administered 2019-05-07: 200 mg via INTRAVENOUS

## 2019-05-07 MED ORDER — CLINDAMYCIN PHOSPHATE 900 MG/50ML IV SOLN
INTRAVENOUS | Status: AC
  Filled 2019-05-07: qty 50

## 2019-05-07 MED ORDER — ACETAMINOPHEN 500 MG PO TABS: 1000.0000 mg | ORAL_TABLET | ORAL | Status: AC

## 2019-05-07 MED ORDER — FAMOTIDINE 20 MG PO TABS: 20.0000 mg | ORAL_TABLET | Freq: Once | ORAL | Status: AC

## 2019-05-07 MED ORDER — BUPIVACAINE-EPINEPHRINE (PF) 0.25% -1:200000 IJ SOLN
INTRAMUSCULAR | Status: AC
  Filled 2019-05-07: qty 30

## 2019-05-07 SURGICAL SUPPLY — 33 items
BLADE SURG 15 STRL LF DISP TIS (BLADE) ×1 IMPLANT
BLADE SURG 15 STRL SS (BLADE) ×2
BRIEF STRETCH MATERNITY 2XLG (MISCELLANEOUS) ×3 IMPLANT
CANISTER SUCT 1200ML W/VALVE (MISCELLANEOUS) ×3 IMPLANT
COVER WAND RF STERILE (DRAPES) ×3 IMPLANT
DERMABOND ADVANCED (GAUZE/BANDAGES/DRESSINGS) ×2
DERMABOND ADVANCED .7 DNX12 (GAUZE/BANDAGES/DRESSINGS) IMPLANT
DRAPE PERI LITHO V/GYN (MISCELLANEOUS) ×3 IMPLANT
DRAPE UNDER BUTTOCK W/FLU (DRAPES) ×3 IMPLANT
DRSG GAUZE FLUFF 36X18 (GAUZE/BANDAGES/DRESSINGS) ×3 IMPLANT
ELECT REM PT RETURN 9FT ADLT (ELECTROSURGICAL) ×3
ELECTRODE REM PT RTRN 9FT ADLT (ELECTROSURGICAL) ×1 IMPLANT
GLOVE BIOGEL PI IND STRL 7.0 (GLOVE) ×1 IMPLANT
GLOVE BIOGEL PI INDICATOR 7.0 (GLOVE) ×2
GLOVE SURG SYN 6.5 ES PF (GLOVE) ×3 IMPLANT
GLOVE SURG SYN 6.5 PF PI (GLOVE) ×1 IMPLANT
GOWN STRL REUS W/ TWL LRG LVL3 (GOWN DISPOSABLE) ×2 IMPLANT
GOWN STRL REUS W/TWL LRG LVL3 (GOWN DISPOSABLE) ×4
KIT TURNOVER CYSTO (KITS) ×3 IMPLANT
LABEL OR SOLS (LABEL) ×3 IMPLANT
NDL HYPO 25X1 1.5 SAFETY (NEEDLE) ×1 IMPLANT
NEEDLE HYPO 22GX1.5 SAFETY (NEEDLE) ×3 IMPLANT
NEEDLE HYPO 25X1 1.5 SAFETY (NEEDLE) ×3 IMPLANT
NS IRRIG 500ML POUR BTL (IV SOLUTION) ×3 IMPLANT
PACK BASIN MINOR ARMC (MISCELLANEOUS) ×3 IMPLANT
PAD PREP 24X41 OB/GYN DISP (PERSONAL CARE ITEMS) ×3 IMPLANT
SOL PREP PVP 2OZ (MISCELLANEOUS) ×3
SOLUTION PREP PVP 2OZ (MISCELLANEOUS) ×1 IMPLANT
SURGILUBE 2OZ TUBE FLIPTOP (MISCELLANEOUS) ×3 IMPLANT
SUT VIC AB 3-0 SH 27 (SUTURE) ×2
SUT VIC AB 3-0 SH 27X BRD (SUTURE) ×1 IMPLANT
SYR 10ML LL (SYRINGE) ×3 IMPLANT
SYR 20ML LL LF (SYRINGE) ×3 IMPLANT

## 2019-05-07 NOTE — Transfer of Care (Signed)
Immediate Anesthesia Transfer of Care Note  Patient: Dwayne Burke  Procedure(s) Performed: EXCISION MASS perineal (Left Perineum)  Patient Location: PACU  Anesthesia Type:General  Level of Consciousness: sedated  Airway & Oxygen Therapy: Patient Spontanous Breathing and Patient connected to face mask oxygen  Post-op Assessment: Report given to RN and Post -op Vital signs reviewed and stable  Post vital signs: Reviewed and stable  Last Vitals:  Vitals Value Taken Time  BP    Temp    Pulse    Resp    SpO2      Last Pain:  Vitals:   05/07/19 0825  TempSrc:   PainSc: (P) Asleep         Complications: No apparent anesthesia complications

## 2019-05-07 NOTE — Op Note (Signed)
Pre-Op Dx: Left perineal mass Post-Op Dx: Left perineal lipoma Anesthesia: GETA EBL: 2 mL Complications:  none apparent Specimen: Left perineal lipoma Procedure: excisional biopsy left perineal lipoma Surgeon: Lysle Pearl  Indications for procedure: See H&P  Description of Procedure:  Consent obtained, time out performed.  Patient placed in high lithotomy position.  Area sterilized and draped in usual position.  Local infused to area previously marked.  5 cm incision made through dermis with 15blade and lipoma noted in subcutaneous layer.  The  5 cm x 4 cm x 3 cm lipoma along with overlying redundant skin then removed from surrounding tissue completely using electrocautery, passed off field pending pathology.  Wound hemostasis noted, then closed in two layer fashion with 3-0 vicryl in interrupted fashion for deep dermal layer, then running 4-0 monocryl in subcuticular fashion for epidermal layer.  Wound then dressed with dermabond.  Pt tolerated procedure well, and transferred to PACU in stable condition. Sponge and instrument count correct at end of procedure.

## 2019-05-07 NOTE — Anesthesia Procedure Notes (Signed)
Procedure Name: LMA Insertion Date/Time: 05/07/2019 7:41 AM Performed by: Justus Memory, CRNA Pre-anesthesia Checklist: Patient identified, Patient being monitored, Timeout performed, Emergency Drugs available and Suction available Patient Re-evaluated:Patient Re-evaluated prior to induction Oxygen Delivery Method: Circle system utilized Preoxygenation: Pre-oxygenation with 100% oxygen Induction Type: IV induction Ventilation: Mask ventilation without difficulty LMA: LMA inserted LMA Size: 3.5 Tube type: Oral Number of attempts: 1 Placement Confirmation: positive ETCO2 and breath sounds checked- equal and bilateral Tube secured with: Tape Dental Injury: Teeth and Oropharynx as per pre-operative assessment

## 2019-05-07 NOTE — Interval H&P Note (Signed)
History and Physical Interval Note:  05/07/2019 7:04 AM  Dwayne Burke  has presented today for surgery, with the diagnosis of N50.89 Perineal mass.  The various methods of treatment have been discussed with the patient and family. After consideration of risks, benefits and other options for treatment, the patient has consented to  Procedure(s): EXCISION MASS perineal (Left) as a surgical intervention.  The patient's history has been reviewed, patient examined, no change in status, stable for surgery.  I have reviewed the patient's chart and labs.  Questions were answered to the patient's satisfaction.     Kaliya Shreiner Lysle Pearl

## 2019-05-07 NOTE — Anesthesia Preprocedure Evaluation (Signed)
Anesthesia Evaluation  Patient identified by MRN, date of birth, ID band Patient awake    Reviewed: Allergy & Precautions, H&P , NPO status , Patient's Chart, lab work & pertinent test results, reviewed documented beta blocker date and time   History of Anesthesia Complications Negative for: history of anesthetic complications  Airway Mallampati: II  TM Distance: >3 FB Neck ROM: full    Dental  (+) Dental Advidsory Given, Teeth Intact   Pulmonary neg pulmonary ROS,    Pulmonary exam normal breath sounds clear to auscultation       Cardiovascular Exercise Tolerance: Good negative cardio ROS Normal cardiovascular exam Rhythm:regular Rate:Normal     Neuro/Psych negative neurological ROS  negative psych ROS   GI/Hepatic negative GI ROS, Neg liver ROS,   Endo/Other  diabetes (borderline)  Renal/GU negative Renal ROS  negative genitourinary   Musculoskeletal   Abdominal   Peds  Hematology negative hematology ROS (+)   Anesthesia Other Findings Past Medical History: No date: Pre-diabetes   Reproductive/Obstetrics negative OB ROS                             Anesthesia Physical Anesthesia Plan  ASA: II  Anesthesia Plan: General   Post-op Pain Management:    Induction: Intravenous  PONV Risk Score and Plan: 2 and Ondansetron, Dexamethasone, Midazolam, Promethazine and Treatment may vary due to age or medical condition  Airway Management Planned: LMA  Additional Equipment:   Intra-op Plan:   Post-operative Plan: Extubation in OR  Informed Consent: I have reviewed the patients History and Physical, chart, labs and discussed the procedure including the risks, benefits and alternatives for the proposed anesthesia with the patient or authorized representative who has indicated his/her understanding and acceptance.     Dental Advisory Given  Plan Discussed with: Anesthesiologist,  CRNA and Surgeon  Anesthesia Plan Comments:         Anesthesia Quick Evaluation

## 2019-05-07 NOTE — Discharge Instructions (Signed)
AMBULATORY SURGERY  °DISCHARGE INSTRUCTIONS ° ° °1) The drugs that you were given will stay in your system until tomorrow so for the next 24 hours you should not: ° °A) Drive an automobile °B) Make any legal decisions °C) Drink any alcoholic beverage ° ° °2) You may resume regular meals tomorrow.  Today it is better to start with liquids and gradually work up to solid foods. ° °You may eat anything you prefer, but it is better to start with liquids, then soup and crackers, and gradually work up to solid foods. ° ° °3) Please notify your doctor immediately if you have any unusual bleeding, trouble breathing, redness and pain at the surgery site, drainage, fever, or pain not relieved by medication. ° ° ° °4) Additional Instructions: ° ° ° ° ° ° ° °Please contact your physician with any problems or Same Day Surgery at [phone], Monday through Friday 6 am to 4 pm, or [facility] [place] Main number at [phone].Removal, Care After °This sheet gives you information about how to care for yourself after your procedure. Your health care provider may also give you more specific instructions. If you have problems or questions, contact your health care provider. °What can I expect after the procedure? °After the procedure, it is common to have: °· Soreness. °· Bruising. °· Itching. °Follow these instructions at home: °site care °Follow instructions from your health care provider about how to take care of your site. Make sure you: °· Wash your hands with soap and water before and after you change your bandage (dressing). If soap and water are not available, use hand sanitizer. °· Leave stitches (sutures), skin glue, or adhesive strips in place. These skin closures may need to stay in place for 2 weeks or longer. If adhesive strip edges start to loosen and curl up, you may trim the loose edges. Do not remove adhesive strips completely unless your health care provider tells you to do that. °· If the area bleeds or  bruises, apply gentle pressure for 10 minutes. °· OK TO SHOWER IN 24HRS ° °Check your site every day for signs of infection. Check for: °· Redness, swelling, or pain. °· Fluid or blood. °· Warmth. °· Pus or a bad smell. ° °General instructions °· Rest and then return to your normal activities as told by your health care provider. °•  tylenol and advil as needed for discomfort.  Please alternate between the two every four hours as needed for pain.   °•  Use narcotics, if prescribed, only when tylenol and motrin is not enough to control pain. °•  325-650mg every 8hrs to max of 3000mg/24hrs (including the 325mg in every norco dose) for the tylenol.   °•  Advil up to 800mg per dose every 8hrs as needed for pain.   °· Keep all follow-up visits as told by your health care provider. This is important. °Contact a health care provider if: °· You have redness, swelling, or pain around your site. °· You have fluid or blood coming from your site. °· Your site feels warm to the touch. °· You have pus or a bad smell coming from your site. °· You have a fever. °· Your sutures, skin glue, or adhesive strips loosen or come off sooner than expected. °Get help right away if: °· You have bleeding that does not stop with pressure or a dressing. °Summary °· After the procedure, it is common to have some soreness, bruising, and itching at the site. °·   Follow instructions from your health care provider about how to take care of your site. °· Check your site every day for signs of infection. °· Contact a health care provider if you have redness, swelling, or pain around your site, or your site feels warm to the touch. °· Keep all follow-up visits as told by your health care provider. This is important. °This information is not intended to replace advice given to you by your health care provider. Make sure you discuss any questions you have with your health care provider. °Document Released: 02/18/2015 Document Revised: 07/22/2017 Document  Reviewed: 07/22/2017 °[employer]vier Interactive Patient Education © 2019 [employer] ° ° ° °

## 2019-05-07 NOTE — Anesthesia Postprocedure Evaluation (Signed)
Anesthesia Post Note  Patient: HULEY JUPIN  Procedure(s) Performed: EXCISION MASS perineal (Left Perineum)  Patient location during evaluation: PACU Anesthesia Type: General Level of consciousness: awake and alert Pain management: pain level controlled Vital Signs Assessment: post-procedure vital signs reviewed and stable Respiratory status: spontaneous breathing, nonlabored ventilation, respiratory function stable and patient connected to nasal cannula oxygen Cardiovascular status: blood pressure returned to baseline and stable Postop Assessment: no apparent nausea or vomiting Anesthetic complications: no     Last Vitals:  Vitals:   05/07/19 0906 05/07/19 0911  BP: (!) 103/55 (!) 108/52  Pulse: 80 84  Resp: 12 20  Temp: (!) 36.1 C (!) 36.1 C  SpO2: 96% 98%    Last Pain:  Vitals:   05/07/19 0911  TempSrc: Temporal  PainSc: 0-No pain                 Martha Clan

## 2019-05-08 LAB — SURGICAL PATHOLOGY

## 2020-04-22 ENCOUNTER — Other Ambulatory Visit: Payer: Self-pay | Admitting: Family Medicine

## 2020-04-22 DIAGNOSIS — J929 Pleural plaque without asbestos: Secondary | ICD-10-CM

## 2020-05-04 ENCOUNTER — Ambulatory Visit
Admission: RE | Admit: 2020-05-04 | Discharge: 2020-05-04 | Disposition: A | Discharge status: Home / Self Care | Payer: BC Managed Care – PPO | Source: Ambulatory Visit | Attending: Family Medicine | Admitting: Family Medicine

## 2020-05-04 ENCOUNTER — Other Ambulatory Visit: Payer: Self-pay

## 2020-05-04 DIAGNOSIS — J929 Pleural plaque without asbestos: Secondary | ICD-10-CM | POA: Diagnosis present

## 2022-06-11 IMAGING — CT CT CHEST W/O CM
1 series · 15 of 34 positions shown, 19 images · non-contrast
Comparison: Chest x-ray dated April 19, 2020 could not be retrieved
for comparison. The report is available for review.

CLINICAL DATA: X-ray follow-up.

EXAM:
CT CHEST WITHOUT CONTRAST
TECHNIQUE: Multidetector CT imaging of the chest was performed following the
standard protocol without IV contrast.

[Series 2: thorax · axial · 0.70mm/px · z∈[-831,-555]mm · 15 of 164 slices shown, 19 images]
[im 13/164  mediastinal]
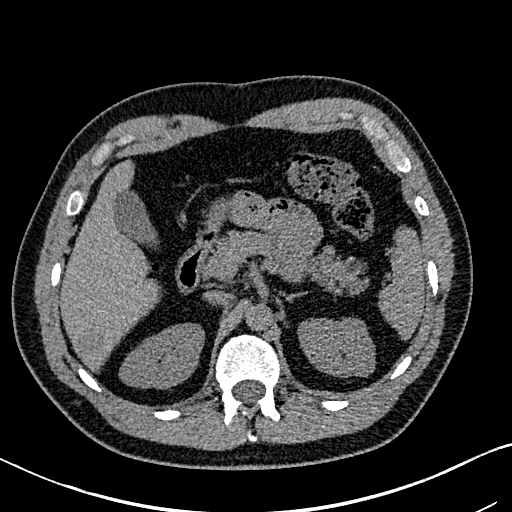
[im 13/164  lung]
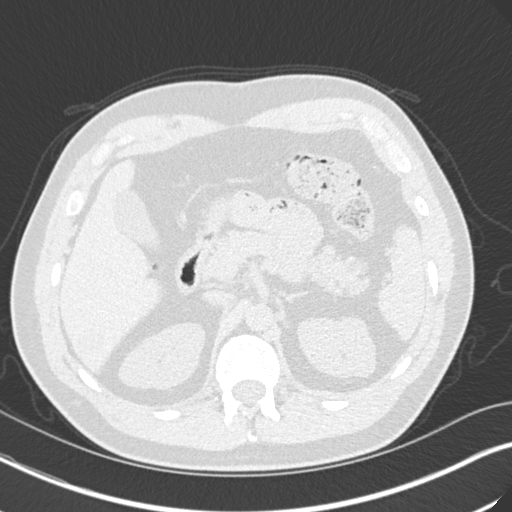
[im 25/164  lung]
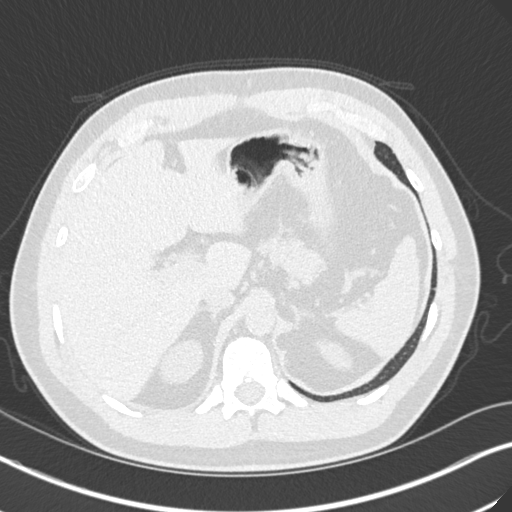
[im 33/164  lung]
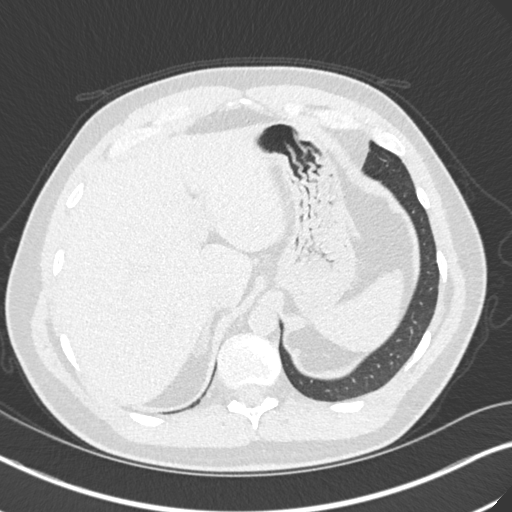
[im 43/164  lung]
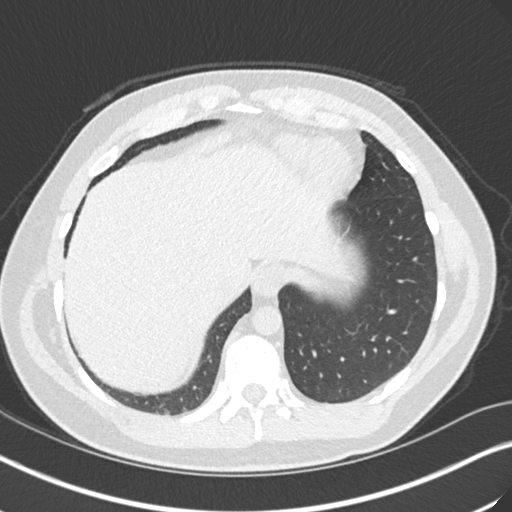
[im 55/164  mediastinal]
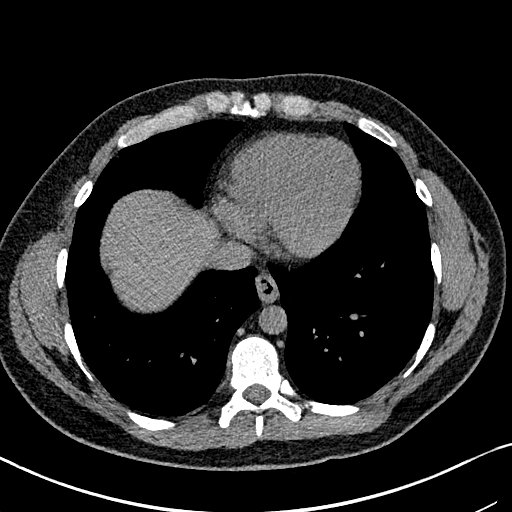
[im 55/164  lung]
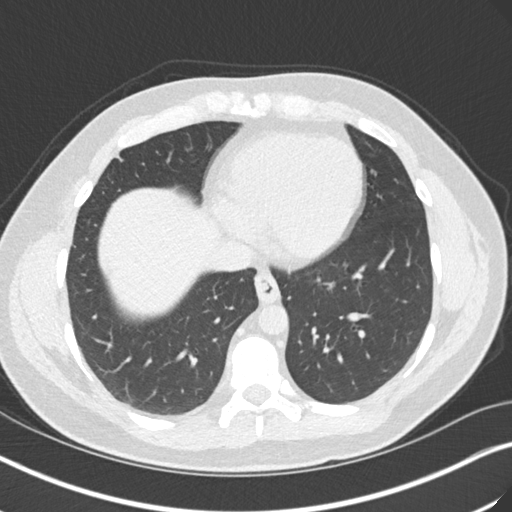
[im 66/164  lung]
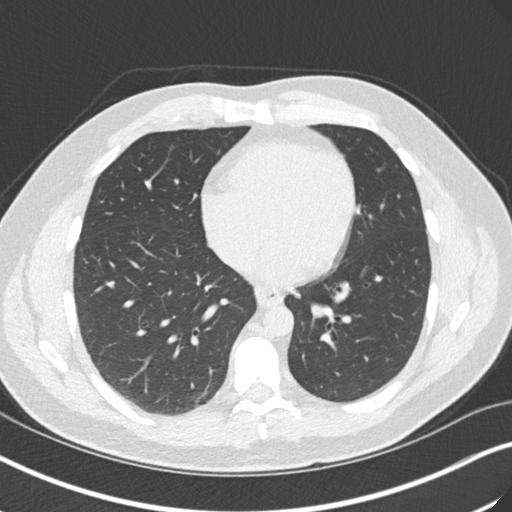
[im 73/164  lung]
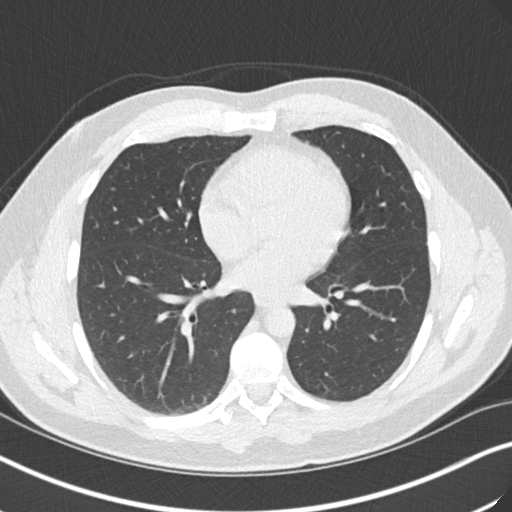
[im 85/164  lung]
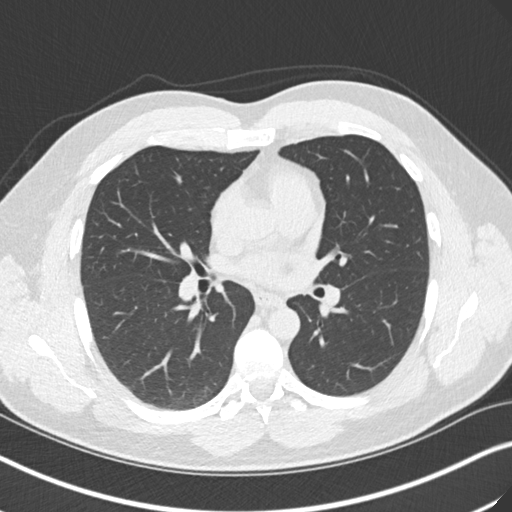
[im 91/164  mediastinal]
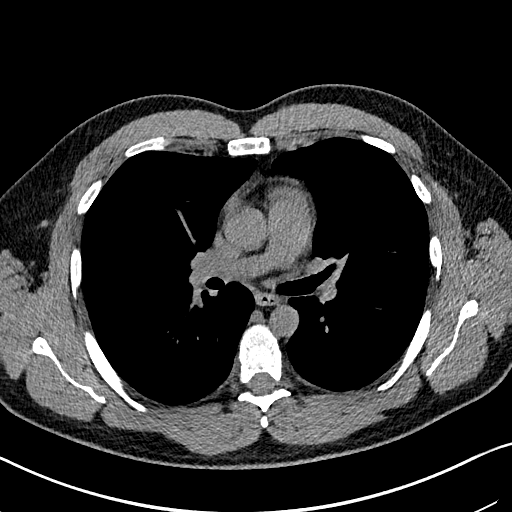
[im 91/164  lung]
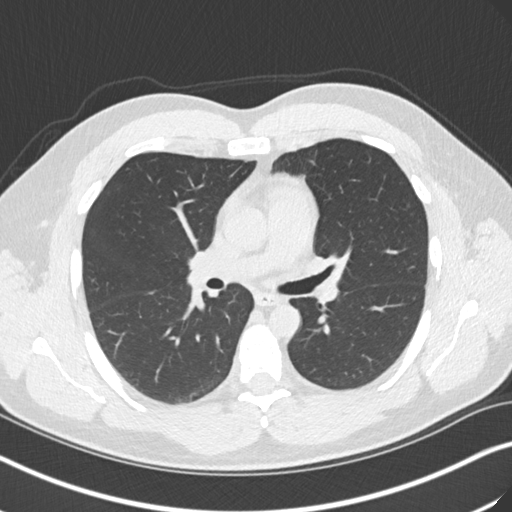
[im 98/164  lung]
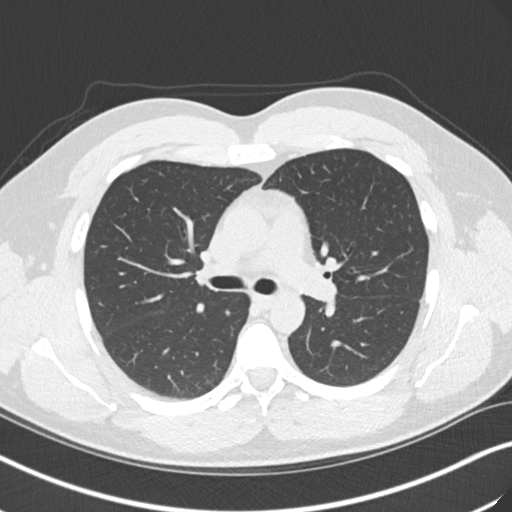
[im 109/164  lung]
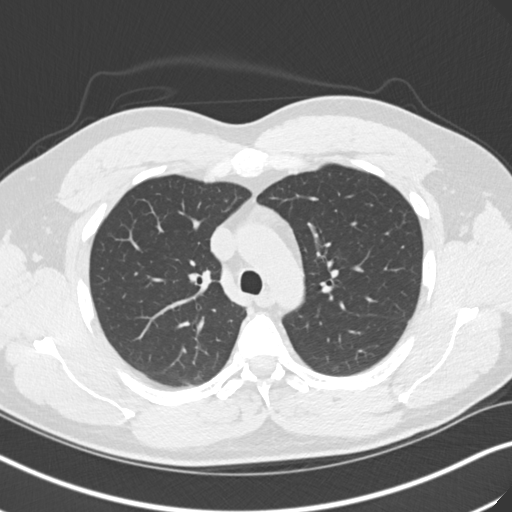
[im 121/164  lung]
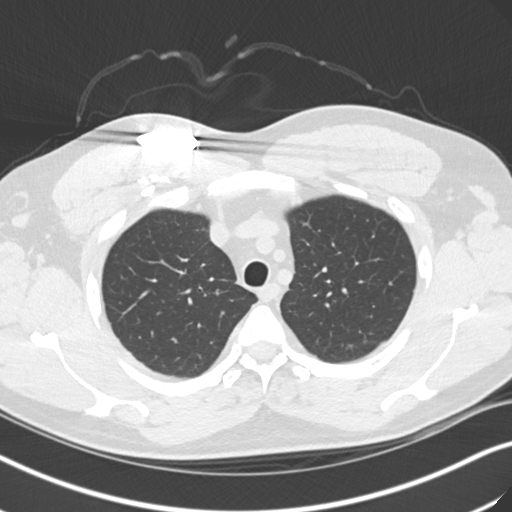
[im 131/164  mediastinal]
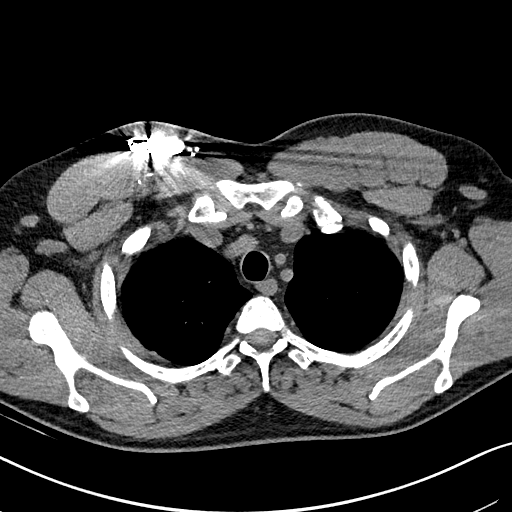
[im 131/164  lung]
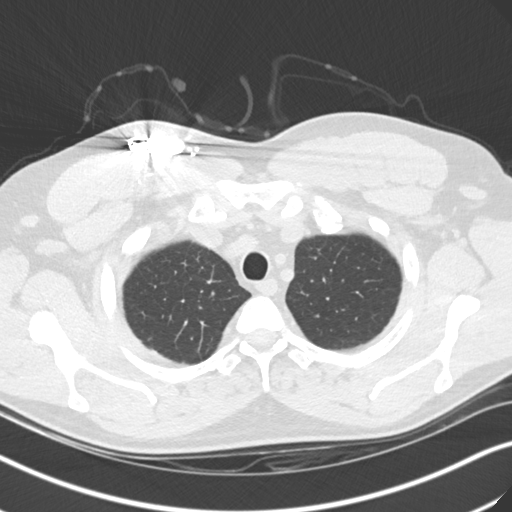
[im 139/164  lung]
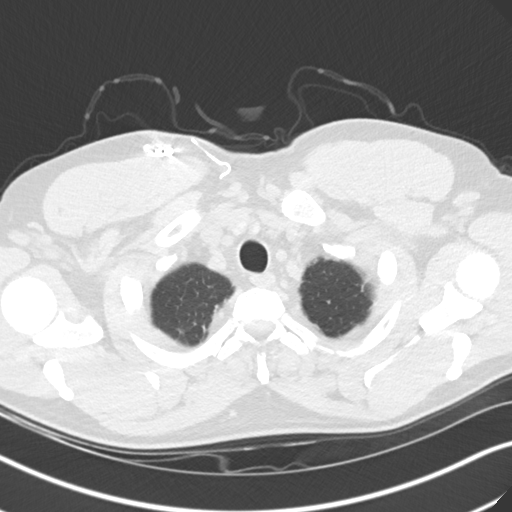
[im 151/164  lung]
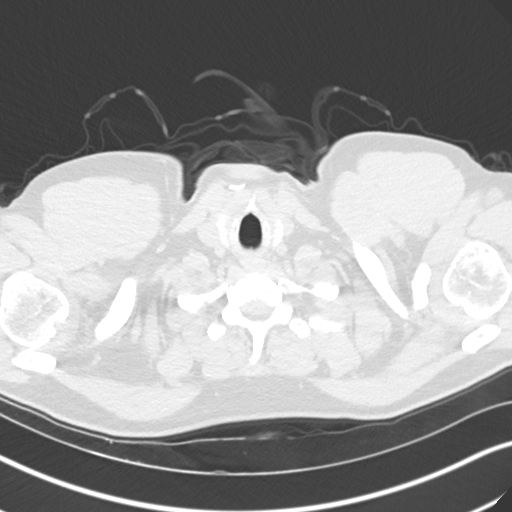

[15 of 34 positions shown; findings below may reference images not displayed]

FINDINGS: Cardiovascular: The heart size is unremarkable. There is no
significant pericardial effusion. No evidence for significant
thoracic aortic atherosclerosis. No evidence for thoracic aortic
aneurysm.

Mediastinum/Nodes:

-- No mediastinal lymphadenopathy.

-- No hilar lymphadenopathy.

-- No axillary lymphadenopathy.

-- No supraclavicular lymphadenopathy.

-- Normal thyroid gland where visualized.

-  Unremarkable esophagus.

Lungs/Pleura: There is some mild pleuroparenchymal scarring at the
lung apices. There is no pneumothorax. No large pleural effusion. No
acute cardiopulmonary process.

Upper Abdomen: There is no acute abnormality in the upper abdomen.
There is a 1.9 cm hypoechoic lesion in the right hepatic lobe
favored to represent the previously demonstrated hemangioma on the
patient's ultrasound from 0220.

Musculoskeletal: No chest wall abnormality. No bony spinal canal
stenosis.
IMPRESSION: 1. No acute abnormality.
2. There is minimal pleuroparenchymal scarring at the lung apices.
